# Patient Record
Sex: Male | Born: 1960 | Race: Black or African American | Hispanic: No | Marital: Married | Smoking: Former smoker
Health system: Southern US, Community
[De-identification: ages and names within clinical notes are randomized; demographics above are authoritative.]

## PROBLEM LIST (undated history)

## (undated) ENCOUNTER — Ambulatory Visit: Discharge status: Absent without leave | Payer: Medicare HMO

## (undated) DIAGNOSIS — M79673 Pain in unspecified foot: Secondary | ICD-10-CM

## (undated) DIAGNOSIS — H409 Unspecified glaucoma: Secondary | ICD-10-CM

## (undated) DIAGNOSIS — H31012 Macula scars of posterior pole (postinflammatory) (post-traumatic), left eye: Secondary | ICD-10-CM

## (undated) DIAGNOSIS — H4720 Unspecified optic atrophy: Secondary | ICD-10-CM

## (undated) DIAGNOSIS — M21969 Unspecified acquired deformity of unspecified lower leg: Secondary | ICD-10-CM

## (undated) DIAGNOSIS — M79672 Pain in left foot: Secondary | ICD-10-CM

## (undated) DIAGNOSIS — L84 Corns and callosities: Secondary | ICD-10-CM

## (undated) DIAGNOSIS — M549 Dorsalgia, unspecified: Secondary | ICD-10-CM

## (undated) DIAGNOSIS — H2510 Age-related nuclear cataract, unspecified eye: Secondary | ICD-10-CM

## (undated) DIAGNOSIS — Q828 Other specified congenital malformations of skin: Secondary | ICD-10-CM

## (undated) DIAGNOSIS — I10 Essential (primary) hypertension: Secondary | ICD-10-CM

## (undated) HISTORY — PX: COLONOSCOPY WITH ESOPHAGOGASTRODUODENOSCOPY (EGD): SHX5779

## (undated) HISTORY — PX: HERNIA REPAIR: SHX51

## (undated) HISTORY — PX: CHOLECYSTECTOMY: SHX55

## (undated) HISTORY — PX: FOOT SURGERY: SHX648

---

## 2009-11-02 ENCOUNTER — Ambulatory Visit: Payer: Self-pay | Admitting: Internal Medicine

## 2009-12-25 ENCOUNTER — Emergency Department: Payer: Self-pay | Admitting: Internal Medicine

## 2009-12-25 ENCOUNTER — Emergency Department: Payer: Self-pay | Admitting: Emergency Medicine

## 2010-03-20 ENCOUNTER — Ambulatory Visit: Payer: Self-pay | Admitting: Internal Medicine

## 2010-09-11 ENCOUNTER — Ambulatory Visit: Payer: Self-pay | Admitting: Internal Medicine

## 2012-09-13 ENCOUNTER — Ambulatory Visit: Payer: Self-pay | Admitting: Internal Medicine

## 2013-12-08 ENCOUNTER — Ambulatory Visit: Payer: Self-pay

## 2016-06-08 ENCOUNTER — Ambulatory Visit (INDEPENDENT_AMBULATORY_CARE_PROVIDER_SITE_OTHER): Payer: Medicare HMO

## 2016-06-08 ENCOUNTER — Ambulatory Visit
Admission: EM | Admit: 2016-06-08 | Discharge: 2016-06-08 | Disposition: A | Discharge status: Home / Self Care | Payer: Medicare HMO | Attending: Family Medicine | Admitting: Family Medicine

## 2016-06-08 DIAGNOSIS — K229 Disease of esophagus, unspecified: Secondary | ICD-10-CM

## 2016-06-08 HISTORY — DX: Pain in left foot: M79.672

## 2016-06-08 HISTORY — DX: Unspecified optic atrophy: H47.20

## 2016-06-08 HISTORY — DX: Dorsalgia, unspecified: M54.9

## 2016-06-08 HISTORY — DX: Corns and callosities: L84

## 2016-06-08 HISTORY — DX: Age-related nuclear cataract, unspecified eye: H25.10

## 2016-06-08 HISTORY — DX: Other specified congenital malformations of skin: Q82.8

## 2016-06-08 HISTORY — DX: Macula scars of posterior pole (postinflammatory) (post-traumatic), left eye: H31.012

## 2016-06-08 HISTORY — DX: Essential (primary) hypertension: I10

## 2016-06-08 HISTORY — DX: Unspecified glaucoma: H40.9

## 2016-06-08 HISTORY — DX: Pain in unspecified foot: M79.673

## 2016-06-08 HISTORY — DX: Unspecified acquired deformity of unspecified lower leg: M21.969

## 2016-06-08 MED ORDER — SUCRALFATE 1 G PO TABS: 2.0000 g | ORAL_TABLET | Freq: Two times a day (BID) | ORAL | 0 refills | Status: DC

## 2016-06-08 MED ORDER — GI COCKTAIL ~~LOC~~
30.0000 mL | Freq: Once | ORAL | Status: AC
  Administered 2016-06-08: 30 mL via ORAL

## 2016-06-08 NOTE — ED Provider Notes (Signed)
[facility]    CSN: [id number] Arrival date & time: 06/08/16  4098     History   Chief Complaint Chief Complaint  Patient presents with  . Aspiration    HPI Chris Cummings is a 56 y.o. male.   Patient is a 56 year old black male who comes in today with a three-day history of esophageal irritation spasm. States he knows fullness Thursday as far as difficulty swallowing food and having discomfort when he ate. States Thursday night and then last night got worse Friday night. He denies ever having trouble like this per se. He did have indigestion before he had his gallbladder out but that was removed about a year ago since then he's not had indigestion and this doesn't feel like indigestion he was having before. He states he only hurts only has discomfort when he started to swallow or eat food. Is much. He stop smoking as a teenager he's had multiple foot surgeries neuropathy. No known drug allergies. No significant family milk history relevant to today's visit. Past medical history does have history of hypertension as well.   The history is provided by the patient and the spouse. No language interpreter was used.  Swallowed Foreign Body  The current episode started more than 2 days ago. Pertinent negatives include no chest pain, no abdominal pain, no headaches and no shortness of breath. The symptoms are aggravated by drinking and swallowing. He has tried nothing for the symptoms.    Past Medical History:  Diagnosis Date  . Back pain   . Callus of foot   . Foot deformity   . Foot pain   . Foot pain, left   . Glaucoma   . Hypertension   . Keratoderma   . Macular scar of left eye   . Optic atrophy of left eye   . Senile nuclear sclerosis     There are no active problems to display for this patient.   Past Surgical History:  Procedure Laterality Date  . CHOLECYSTECTOMY    . COLONOSCOPY WITH ESOPHAGOGASTRODUODENOSCOPY (EGD)    . FOOT SURGERY    . HERNIA REPAIR          Home Medications    Prior to Admission medications   Medication Sig Start Date End Date Taking? Authorizing Provider  azelastine (OPTIVAR) 0.05 % ophthalmic solution 1 drop as needed.   Yes Historical Provider, MD  cetirizine (ZYRTEC) 10 MG tablet Take 10 mg by mouth daily.   Yes Historical Provider, MD  gabapentin (NEURONTIN) 600 MG tablet Take 600 mg by mouth 2 (two) times daily.   Yes Historical Provider, MD  ibuprofen (ADVIL,MOTRIN) 400 MG tablet Take 400 mg by mouth every 6 (six) hours as needed.   Yes Historical Provider, MD  losartan-hydrochlorothiazide (HYZAAR) 100-25 MG tablet Take 1 tablet by mouth daily.   Yes Historical Provider, MD  meloxicam (MOBIC) 15 MG tablet Take 15 mg by mouth daily.   Yes Historical Provider, MD  Multiple Vitamins-Minerals (MULTIVITAMIN WITH MINERALS) tablet Take 1 tablet by mouth daily.   Yes Historical Provider, MD  naproxen sodium (ANAPROX) 220 MG tablet Take 220 mg by mouth 2 (two) times daily with a meal.   Yes Historical Provider, MD  oxyCODONE (ROXICODONE) 15 MG immediate release tablet Take 15 mg by mouth every 4 (four) hours as needed for pain.   Yes Historical Provider, MD  sucralfate (CARAFATE) 1 g tablet Take 2 tablets (2 g total) by mouth 2 (two) times daily. An  hour before eating. 06/08/16   Hassan RowanEugene Niema Carrara, MD    Family History No family history on file.  Social History Social History  Substance Use Topics  . Smoking status: Former Games developermoker  . Smokeless tobacco: Former NeurosurgeonUser  . Alcohol use Yes     Allergies   Patient has no known allergies.   Review of Systems Review of Systems  Respiratory: Negative for shortness of breath.   Cardiovascular: Negative for chest pain.  Gastrointestinal: Negative for abdominal pain.       Difficulty swallowing  Neurological: Negative for headaches.     Physical Exam Triage Vital Signs ED Triage Vitals  Enc Vitals Group     BP      Pulse      Resp      Temp      Temp src      SpO2       Weight      Height      Head Circumference      Peak Flow      Pain Score      Pain Loc      Pain Edu?      Excl. in GC?    No data found.   Updated Vital Signs BP (!) 160/110 (BP Location: Left Arm)   Pulse 74   Temp 97.9 F (36.6 C) (Oral)   Resp 16   Ht 6' (1.829 m)   Wt 300 lb (136.1 kg)   SpO2 97%   BMI 40.69 kg/m   Visual Acuity Right Eye Distance:   Left Eye Distance:   Bilateral Distance:    Right Eye Near:   Left Eye Near:    Bilateral Near:     Physical Exam  Constitutional: He appears well-developed and well-nourished. He is active.  Non-toxic appearance. He does not have a sickly appearance. He does not appear ill. No distress.  HENT:  Head: Normocephalic and atraumatic.  Eyes: Pupils are equal, round, and reactive to light.  Neck: Normal range of motion. Neck supple. No thyromegaly present.  Cardiovascular: Normal rate.   Pulmonary/Chest: Effort normal and breath sounds normal.  Abdominal: Soft. Bowel sounds are normal. He exhibits no distension.  Musculoskeletal: Normal range of motion.  Neurological: He is alert.  Skin:  Patient is more scar tissue over his right forearm where they removed tendon graft for his leg  Psychiatric: He has a normal mood and affect.  Vitals reviewed.    UC Treatments / Results  Labs (all labs ordered are listed, but only abnormal results are displayed) Labs Reviewed - No data to display  EKG  EKG Interpretation None       Radiology Dg Chest 2 View  Result Date: 06/08/2016 CLINICAL DATA:  Dysphagia. EXAM: CHEST  2 VIEW COMPARISON:  None. FINDINGS: The heart size and mediastinal contours are within normal limits. Both lungs are clear. No pneumothorax or pleural effusion is noted. The visualized skeletal structures are unremarkable. IMPRESSION: No active cardiopulmonary disease. Electronically Signed   By: Lupita RaiderJames  Green Jr, M.D.   On: 06/08/2016 10:41    Procedures Procedures (including critical care  time)  Medications Ordered in UC Medications  gi cocktail (Maalox,Lidocaine,Donnatal) (30 mLs Oral Given 06/08/16 1019)     Initial Impression / Assessment and Plan / UC Course  I have reviewed the triage vital signs and the nursing notes.  Pertinent labs & imaging results that were available during my care of the patient were reviewed by me  and considered in my medical decision making (see chart for details).  Clinical Course as of Jun 09 1130  Sat Jun 08, 2016  1054 DG Chest 2 View [EW]    Clinical Course User Index [EW] Hassan Rowan, MD   Chest x-ray was negative because chest x-ray is negative and the only symptoms he has occurs after he swallows he was given upper GI which did seem to help his symptoms and problems. Going recommend follow-up with his PCP strongly suggest he get GI referral for upper endoscopy in the very near future. For now we'll place him on Carafate 2 tablets twice a day. Warned this could be coming from some type of esophageal stricture or esophageal reflux or irritation but going to really find out as positive with upper endoscopy. Also blood pressure was elevated so his blood pressures rechecked while he was here and still found to be elevated even though the esophageal discomfort was better. Turns out that because the difficulty has swallowing he's not taken his blood pressure medicine for the last 2 days. This point no further attempts to measure his blood pressure will be done since his stroke on probably be elevated. Strongly recommend he takes blood pressure medicine when gets home and have this followed up with his PCP as well.  Final Clinical Impressions(s) / UC Diagnoses   Final diagnoses:  Esophageal problem    New Prescriptions Discharge Medication List as of 06/08/2016 11:07 AM      Note: This dictation was prepared with Dragon dictation along with smaller phrase technology. Any transcriptional errors that result from this process are  unintentional.   Hassan Rowan, MD 06/08/16 1134

## 2016-06-08 NOTE — ED Triage Notes (Signed)
As per patient feels pain while swallowing foods onset yesterday it feels like heart burn but it's not heart burn as per patient and had a gall bladder surgery in past.

## 2016-06-11 ENCOUNTER — Telehealth: Payer: Self-pay

## 2016-06-11 NOTE — Telephone Encounter (Signed)
Courtesy call back completed today after patient's visit at [facility]. Patient improved and will call back with any questions or concerns.  

## 2016-12-28 ENCOUNTER — Encounter: Payer: Self-pay | Admitting: Emergency Medicine

## 2016-12-28 ENCOUNTER — Ambulatory Visit
Admission: EM | Admit: 2016-12-28 | Discharge: 2016-12-28 | Disposition: A | Discharge status: Hospice | Payer: Medicare HMO | Attending: Emergency Medicine | Admitting: Emergency Medicine

## 2016-12-28 DIAGNOSIS — Z87891 Personal history of nicotine dependence: Secondary | ICD-10-CM | POA: Diagnosis not present

## 2016-12-28 DIAGNOSIS — H2510 Age-related nuclear cataract, unspecified eye: Secondary | ICD-10-CM | POA: Insufficient documentation

## 2016-12-28 DIAGNOSIS — I160 Hypertensive urgency: Secondary | ICD-10-CM | POA: Diagnosis present

## 2016-12-28 DIAGNOSIS — R51 Headache: Secondary | ICD-10-CM

## 2016-12-28 DIAGNOSIS — H409 Unspecified glaucoma: Secondary | ICD-10-CM | POA: Insufficient documentation

## 2016-12-28 DIAGNOSIS — I10 Essential (primary) hypertension: Secondary | ICD-10-CM | POA: Diagnosis not present

## 2016-12-28 NOTE — ED Provider Notes (Signed)
[facility]    CSN: [id number] Arrival date & time: 12/28/16  1210     History   Chief Complaint Chief Complaint  Patient presents with  . Hypertension    HPI Chris Cummings is a 56 y.o. male.   Pt reports waking up ~ 0800 today and "I knew my Blood pressure was up, took meds(Losartan HCTZ), still has headache, now c/o pain radiating from back of head down left side of neck to right hand, pt states he normally has neck pain in back with HA when pressure is up but left arm pain is normal. Pt denies Cp, palpitations, SOB, or DOE.   BP is 167/119 Right arm, left arm is 171/109   The history is provided by the patient and a relative.  Hypertension  This is a new problem. The current episode started 3 to 5 hours ago. The problem occurs constantly. The problem has not changed since onset.Associated symptoms include headaches. Pertinent negatives include no chest pain, no abdominal pain and no shortness of breath. Nothing aggravates the symptoms. Nothing relieves the symptoms. Treatments tried: BP med. The treatment provided no relief.    Past Medical History:  Diagnosis Date  . Back pain   . Callus of foot   . Foot deformity   . Foot pain   . Foot pain, left   . Glaucoma   . Hypertension   . Keratoderma   . Macular scar of left eye   . Optic atrophy of left eye   . Senile nuclear sclerosis     Patient Active Problem List   Diagnosis Date Noted  . Hypertensive urgency 12/28/2016    Past Surgical History:  Procedure Laterality Date  . CHOLECYSTECTOMY    . COLONOSCOPY WITH ESOPHAGOGASTRODUODENOSCOPY (EGD)    . FOOT SURGERY    . HERNIA REPAIR         Home Medications    Prior to Admission medications   Medication Sig Start Date End Date Taking? Authorizing Provider  cetirizine (ZYRTEC) 10 MG tablet Take 10 mg by mouth daily.   Yes [provider]  gabapentin (NEURONTIN) 600 MG tablet Take 600 mg by mouth 2 (two) times daily.   Yes [provider]  losartan-hydrochlorothiazide (HYZAAR) 100-25 MG tablet Take 1 tablet by mouth daily.   Yes [provider]  azelastine (OPTIVAR) 0.05 % ophthalmic solution 1 drop as needed.    [provider]  ibuprofen (ADVIL,MOTRIN) 400 MG tablet Take 400 mg by mouth every 6 (six) hours as needed.    [provider]  meloxicam (MOBIC) 15 MG tablet Take 15 mg by mouth daily.    [provider]  Multiple Vitamins-Minerals (MULTIVITAMIN WITH MINERALS) tablet Take 1 tablet by mouth daily.    [provider]  naproxen sodium (ANAPROX) 220 MG tablet Take 220 mg by mouth 2 (two) times daily with a meal.    [provider]  oxyCODONE (ROXICODONE) 15 MG immediate release tablet Take 15 mg by mouth every 4 (four) hours as needed for pain.    [provider]  sucralfate (CARAFATE) 1 g tablet Take 2 tablets (2 g total) by mouth 2 (two) times daily. An hour before eating. 06/08/16   Hassan Rowan, MD    Family History No family history on file.  Social History Social History  Substance Use Topics  . Smoking status: Former Games developer  . Smokeless tobacco: Former Neurosurgeon  . Alcohol use Yes  Allergies   Patient has no known allergies.   Review of Systems Review of Systems  Respiratory: Negative for cough and shortness of breath.   Cardiovascular: Negative for chest pain and palpitations.  Gastrointestinal: Negative for abdominal pain.  Neurological: Positive for headaches.  All other systems reviewed and are negative.    Physical Exam Triage Vital Signs ED Triage Vitals  Enc Vitals Group     BP 12/28/16 1253 (!) 160/104     Pulse Rate 12/28/16 1253 66     Resp 12/28/16 1253 18     Temp 12/28/16 1253 98.2 F (36.8 C)     Temp Source 12/28/16 1253 Oral     SpO2 12/28/16 1253 94 %     Weight 12/28/16 1254 (!) 310 lb (140.6 kg)     Height 12/28/16 1254  (1.803 m)     Head Circumference --      Peak Flow --      Pain Score  12/28/16 1254 5     Pain Loc --      Pain Edu? --      Excl. in GC? --    No data found.   Updated Vital Signs BP (!) 171/109 (BP Location: Left Arm) Comment: left arm  Pulse 66   Temp 98.2 F (36.8 C) (Oral)   Resp 18   Ht  (1.803 m)   Wt (!) 310 lb (140.6 kg)   SpO2 94%   BMI 43.24 kg/m   Visual Acuity  Physical Exam  Constitutional: He is oriented to person, place, and time. He appears well-developed and well-nourished. He is active and cooperative. He appears distressed.  Pt is hypertensive  HENT:  Head: Normocephalic.  Mouth/Throat: Uvula is midline, oropharynx is clear and moist and mucous membranes are normal.  Eyes: Pupils are equal, round, and reactive to light.  Neck: Normal range of motion.  Cardiovascular: Normal rate, regular rhythm, normal heart sounds and intact distal pulses.   Pulmonary/Chest: Effort normal and breath sounds normal.  Abdominal:  obese  Musculoskeletal: Normal range of motion.  Neurological: He is alert and oriented to person, place, and time. He has normal strength. No cranial nerve deficit or sensory deficit. Gait normal.  Skin: Skin is dry.  Psychiatric: He has a normal mood and affect. His speech is normal.  Nursing note and vitals reviewed.    UC Treatments / Results  Labs (all labs ordered are listed, but only abnormal results are displayed) Labs Reviewed - No data to display  EKG  EKG Interpretation None       Radiology No results found.  Procedures Procedures (including critical care time)  Medications Ordered in UC Medications - No data to display   Initial Impression / Assessment and Plan / UC Course  I have reviewed the triage vital signs and the nursing notes.  Pertinent labs & imaging results that were available during my care of the patient were reviewed by me and considered in my medical decision making (see chart for details).   EKG obtained d/t age, BP CC: shows NSR rate 62, no ectopy, normal  axis, no STEMI.   Discussed EKG and plan of care with Dr. Chaney Malling, per MD,  advised to send pt to Er for further evaluation most like IV Labetalol additional testing at ER(we do not have CT or IV meds; recommend EMS transport, wife is present and both pt and wife decline EMS transport, want to drive to ER, state they will  go to [facility] ER now. Will call report. Pt denies Cp,SOB, palpitations or additional s/s at d/c. Pt verbalized understanding to this provider  Final Clinical Impressions(s) / UC Diagnoses   Final diagnoses:  Hypertensive urgency    New Prescriptions Discharge Medication List as of 12/28/2016  1:30 PM          Janiyah Beery, Para March, NP 12/28/16 1730

## 2016-12-28 NOTE — Discharge Instructions (Signed)
Do not eat or drink anything. Go straight to Er for further evaluation. Pt and wife verbalize understanding to this provider.

## 2016-12-28 NOTE — ED Triage Notes (Signed)
Elevated blood pressure, headache, pain radiates from neck down left arm started this morning

## 2017-07-04 IMAGING — CR DG CHEST 2V
3 series · 3 of 3 positions shown · non-contrast
Comparison: None.

CLINICAL DATA: Dysphagia.

EXAM:
CHEST  2 VIEW

[chest pa]
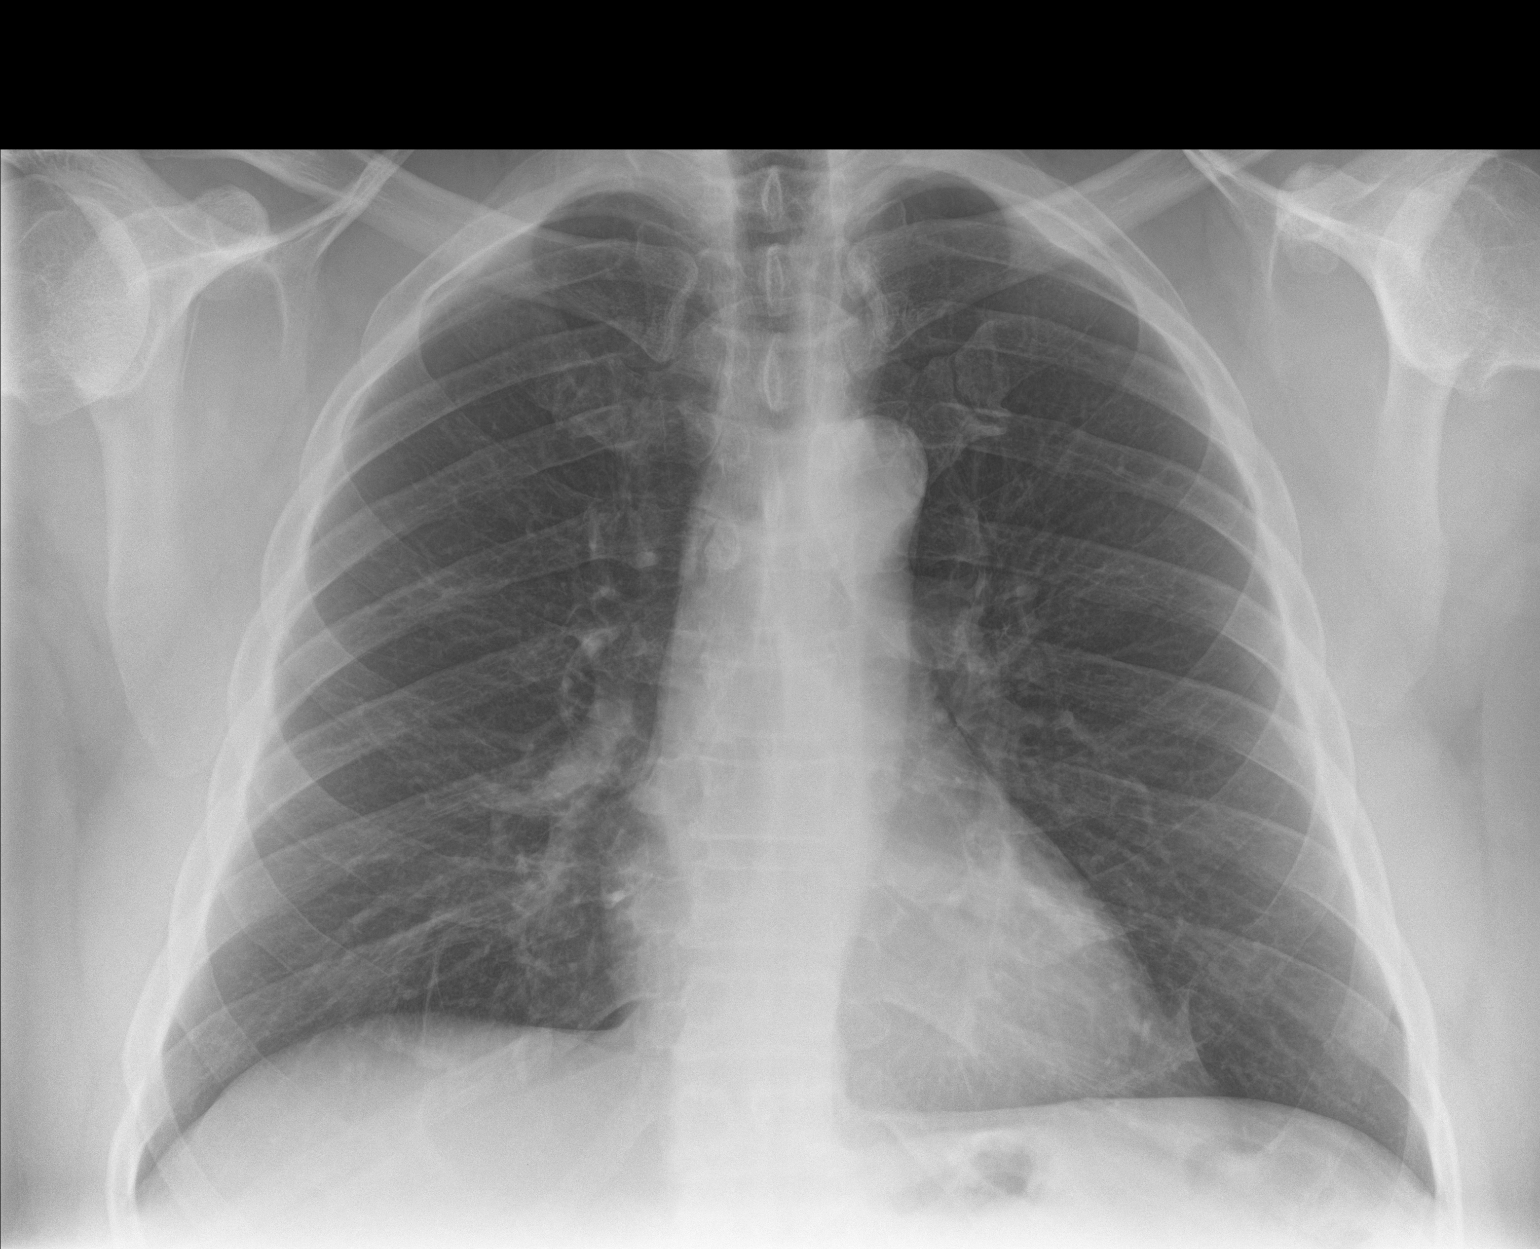

[chest lat (1 of 2)]
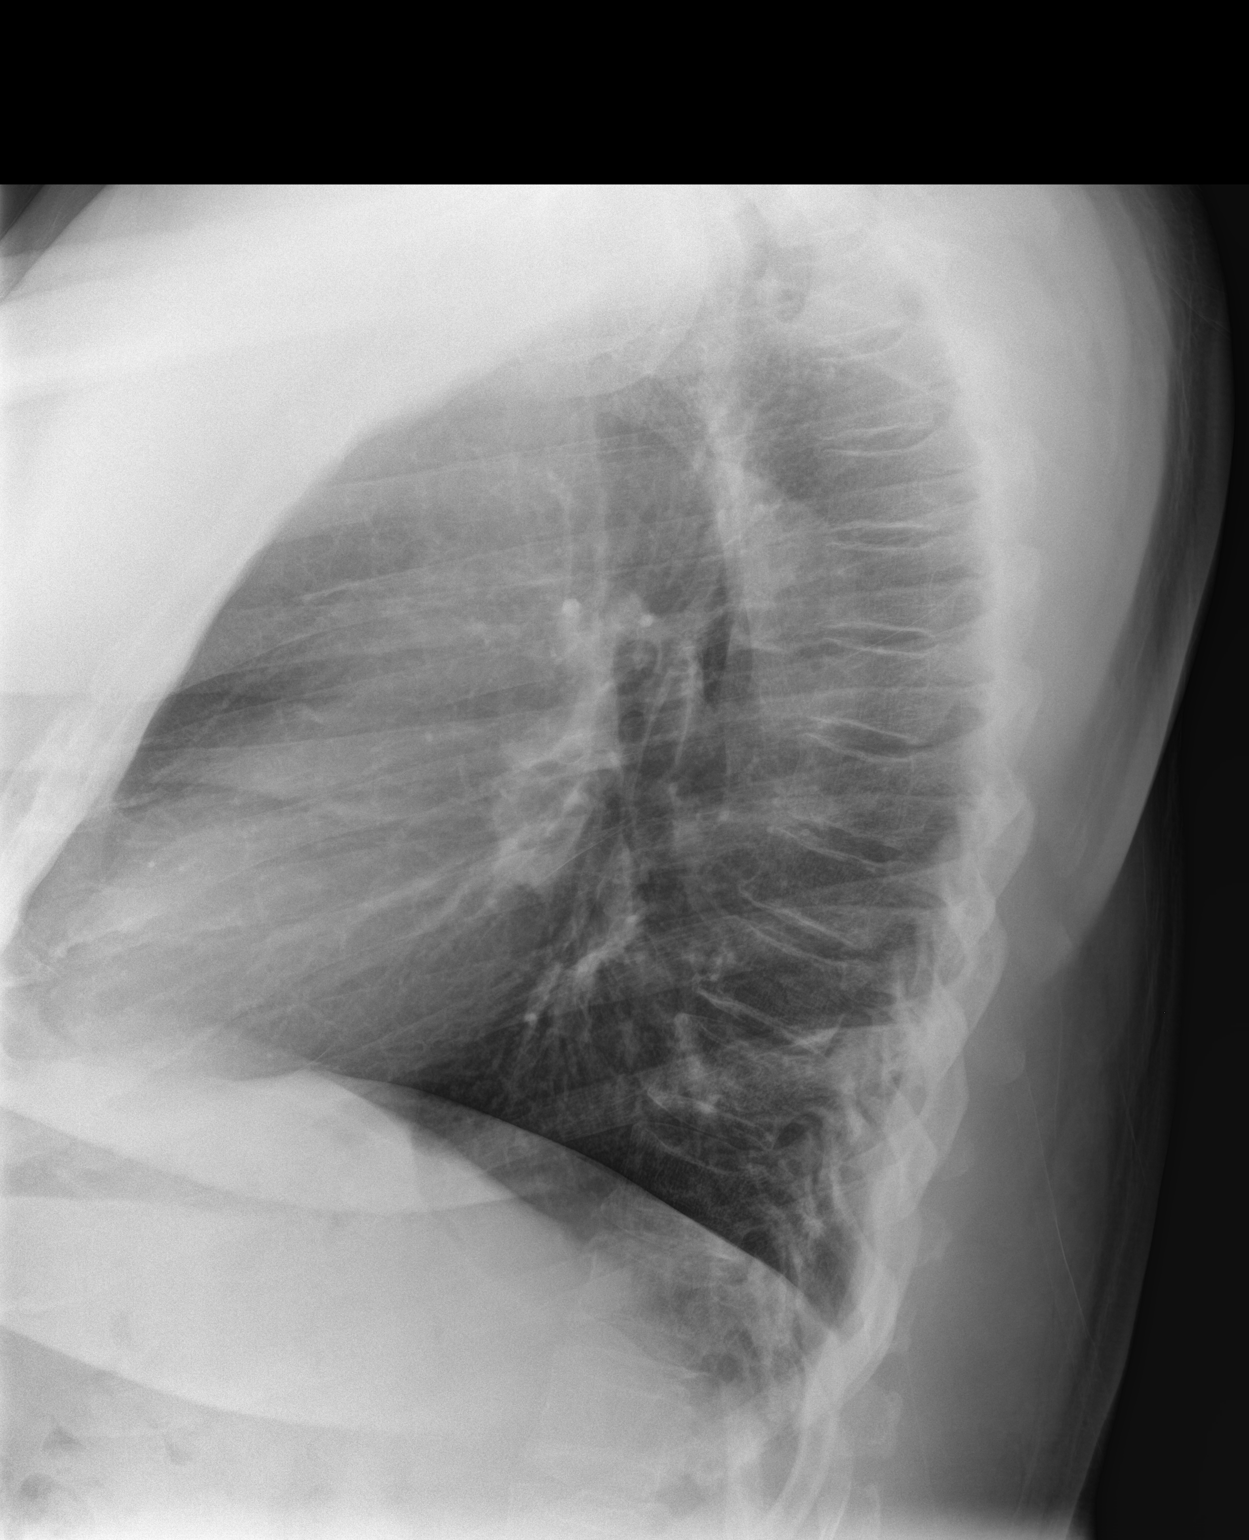

[chest lat (2 of 2)]
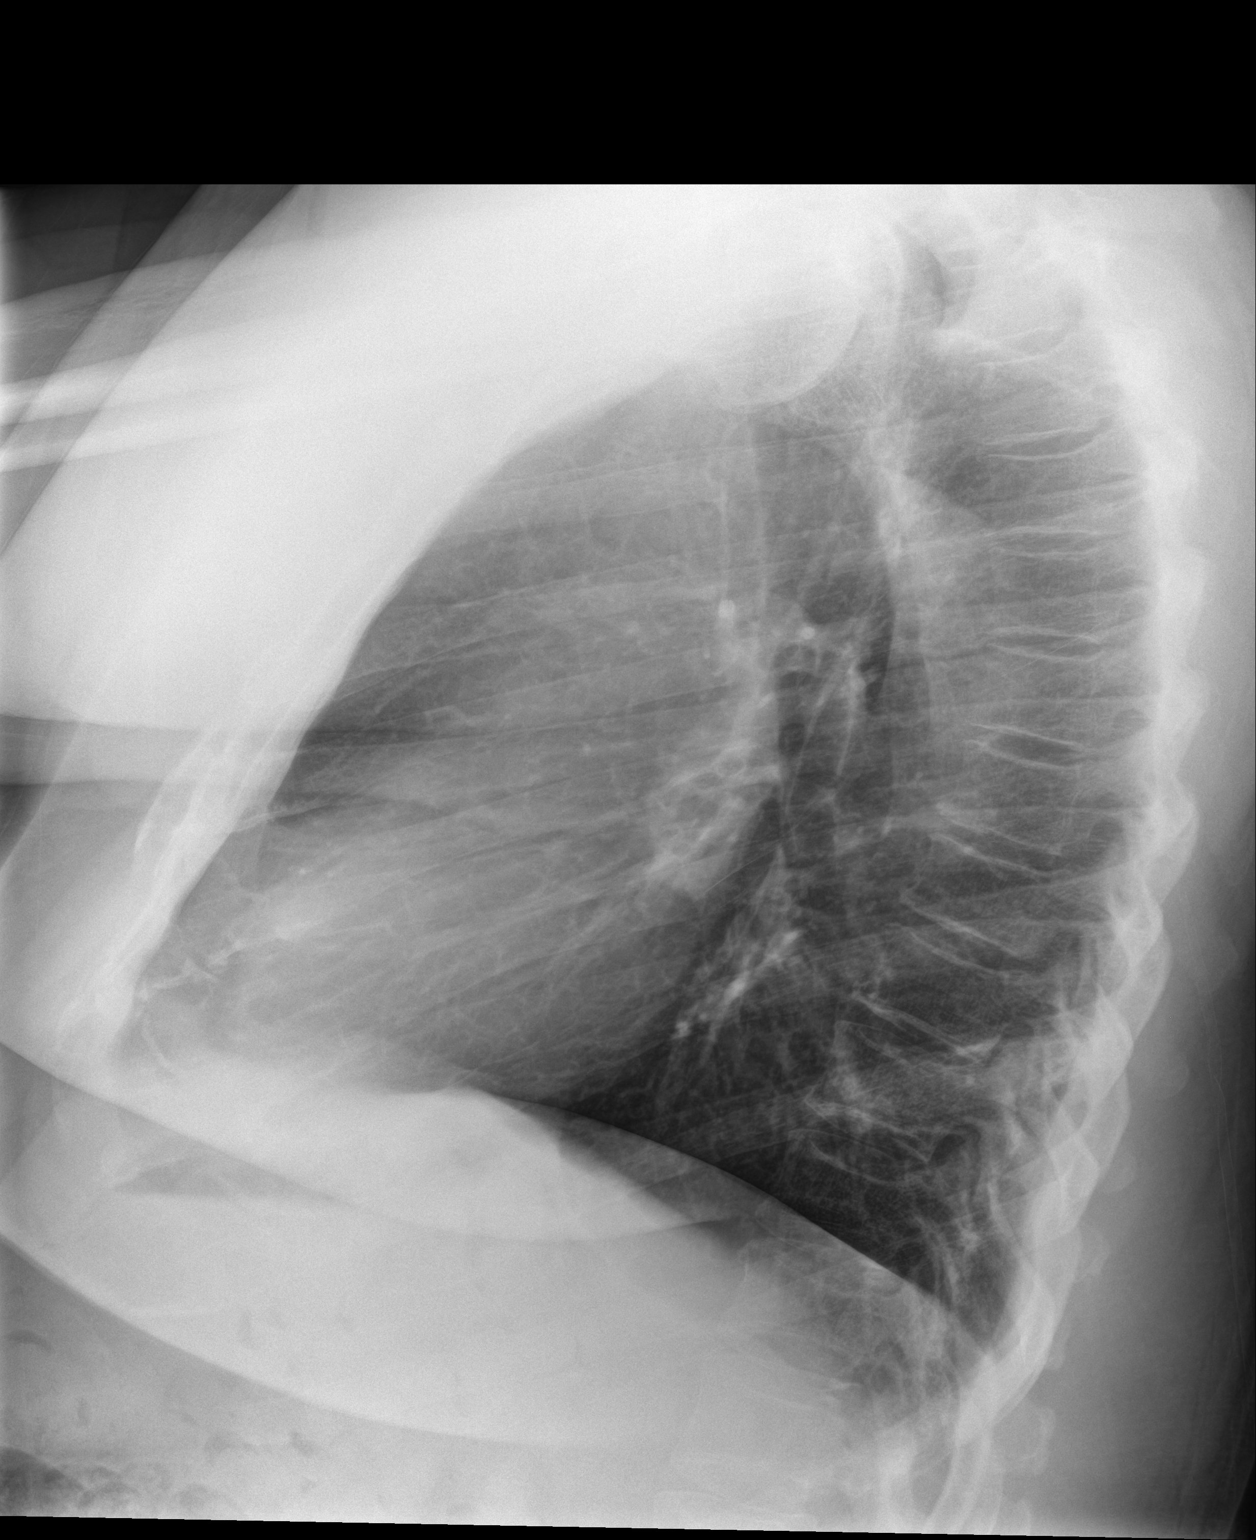

[3 of 3 positions shown; findings below may reference images not displayed]

FINDINGS: The heart size and mediastinal contours are within normal limits.
Both lungs are clear. No pneumothorax or pleural effusion is noted.
The visualized skeletal structures are unremarkable.
IMPRESSION: No active cardiopulmonary disease.

## 2017-08-14 ENCOUNTER — Other Ambulatory Visit: Payer: Self-pay

## 2017-08-14 ENCOUNTER — Ambulatory Visit
Admission: EM | Admit: 2017-08-14 | Discharge: 2017-08-14 | Disposition: A | Discharge status: Home / Self Care | Payer: Medicare HMO | Attending: Family Medicine | Admitting: Family Medicine

## 2017-08-14 ENCOUNTER — Encounter: Payer: Self-pay | Admitting: Gynecology

## 2017-08-14 DIAGNOSIS — R51 Headache: Secondary | ICD-10-CM | POA: Diagnosis not present

## 2017-08-14 DIAGNOSIS — M542 Cervicalgia: Secondary | ICD-10-CM

## 2017-08-14 MED ORDER — CYCLOBENZAPRINE HCL 10 MG PO TABS: 10.0000 mg | ORAL_TABLET | Freq: Three times a day (TID) | ORAL | 0 refills | Status: DC | PRN
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
## 2017-08-14 NOTE — Discharge Instructions (Signed)
Rest.  Heat as needed.  Use the muscle relaxer to help.  Tylenol if needed in addition to your normal pain medication.  Take care  Dr. Adriana Simas

## 2018-02-16 ENCOUNTER — Ambulatory Visit
Admission: EM | Admit: 2018-02-16 | Discharge: 2018-02-16 | Disposition: A | Discharge status: Home / Self Care | Payer: Medicare HMO | Attending: Family Medicine | Admitting: Family Medicine

## 2018-02-16 ENCOUNTER — Encounter: Payer: Self-pay | Admitting: Emergency Medicine

## 2018-02-16 ENCOUNTER — Other Ambulatory Visit: Payer: Self-pay

## 2018-02-16 DIAGNOSIS — J029 Acute pharyngitis, unspecified: Secondary | ICD-10-CM

## 2018-02-16 LAB — RAPID STREP SCREEN (MED CTR MEBANE ONLY): Streptococcus, Group A Screen (Direct): NEGATIVE

## 2018-02-16 MED ORDER — LIDOCAINE VISCOUS HCL 2 % MT SOLN: OROMUCOSAL | 0 refills | Status: DC

## 2018-02-16 NOTE — ED Provider Notes (Signed)
[facility]    CSN: [id number] Arrival date & time: 02/16/18  4098     History   Chief Complaint Chief Complaint  Patient presents with  . Sore Throat    HPI Chris Cummings is a 57 y.o. male.   The history is provided by the patient.  Sore Throat  This is a new problem. The current episode started more than 2 days ago. The problem occurs constantly. The problem has not changed since onset.Pertinent negatives include no chest pain, no abdominal pain, no headaches and no shortness of breath. He has tried acetaminophen for the symptoms.    Past Medical History:  Diagnosis Date  . Back pain   . Callus of foot   . Foot deformity   . Foot pain   . Foot pain, left   . Glaucoma   . Hypertension   . Keratoderma   . Macular scar of left eye   . Optic atrophy of left eye   . Senile nuclear sclerosis     Patient Active Problem List   Diagnosis Date Noted  . Hypertensive urgency 12/28/2016    Past Surgical History:  Procedure Laterality Date  . CHOLECYSTECTOMY    . COLONOSCOPY WITH ESOPHAGOGASTRODUODENOSCOPY (EGD)    . FOOT SURGERY    . HERNIA REPAIR         Home Medications    Prior to Admission medications   Medication Sig Start Date End Date Taking? Authorizing Provider  cetirizine (ZYRTEC) 10 MG tablet Take 10 mg by mouth daily.   Yes [provider]  cyclobenzaprine (FLEXERIL) 10 MG tablet Take 1 tablet (10 mg total) by mouth 3 (three) times daily as needed. 08/14/17  Yes Cook, Jayce G, DO  losartan-hydrochlorothiazide (HYZAAR) 100-25 MG tablet Take 1 tablet by mouth daily.   Yes [provider]  oxyCODONE (ROXICODONE) 15 MG immediate release tablet Take 15 mg by mouth every 4 (four) hours as needed for pain.   Yes [provider]  azelastine (OPTIVAR) 0.05 % ophthalmic solution 1 drop as needed.    [provider]  cholecalciferol (VITAMIN D) 1000 units tablet Take by mouth daily.    [provider]    lidocaine (XYLOCAINE) 2 % solution 20 ml gargle and spit q 6 hours prn sore throat 02/16/18   [name], MD    Family History History reviewed. No pertinent family history.  Social History Social History   Tobacco Use  . Smoking status: Former Games developer  . Smokeless tobacco: Former Engineer, water Use Topics  . Alcohol use: Yes  . Drug use: No     Allergies   Patient has no known allergies.   Review of Systems Review of Systems  Respiratory: Negative for shortness of breath.   Cardiovascular: Negative for chest pain.  Gastrointestinal: Negative for abdominal pain.  Neurological: Negative for headaches.     Physical Exam Triage Vital Signs ED Triage Vitals  Enc Vitals Group     BP 02/16/18 0844 (!) 158/106     Pulse Rate 02/16/18 0844 75     Resp 02/16/18 0844 18     Temp 02/16/18 0844 97.9 F (36.6 C)     Temp Source 02/16/18 0844 Oral     SpO2 02/16/18 0844 97 %     Weight 02/16/18 0842 (!) 305 lb (138.3 kg)     Height 02/16/18 0842 6' (1.829 m)     Head Circumference --      Peak  Flow --      Pain Score 02/16/18 0842 4     Pain Loc --      Pain Edu? --      Excl. in GC? --    No data found.  Updated Vital Signs BP (!) 158/106 (BP Location: Left Arm)   Pulse 75   Temp 97.9 F (36.6 C) (Oral)   Resp 18   Ht 6' (1.829 m)   Wt (!) 138.3 kg   SpO2 97%   BMI 41.37 kg/m   Visual Acuity Right Eye Distance:   Left Eye Distance:   Bilateral Distance:    Right Eye Near:   Left Eye Near:    Bilateral Near:     Physical Exam  Constitutional: He appears well-developed and well-nourished.  Non-toxic appearance. He does not appear ill. No distress.  HENT:  Head: Normocephalic and atraumatic.  Right Ear: Tympanic membrane and ear canal normal.  Left Ear: Tympanic membrane and ear canal normal.  Nose: Nose normal.  Mouth/Throat: Uvula is midline and mucous membranes are normal. Oral lesions (2 superficial ulcers (2mm each) on posterior pharynx )  present. Posterior oropharyngeal erythema present. No tonsillar exudate.  Nursing note and vitals reviewed.    UC Treatments / Results  Labs (all labs ordered are listed, but only abnormal results are displayed) Labs Reviewed  RAPID STREP SCREEN ([facility] ONLY)  CULTURE, GROUP A STREP [facility])    EKG None  Radiology No results found.  Procedures Procedures (including critical care time)  Medications Ordered in UC Medications - No data to display  Initial Impression / Assessment and Plan / UC Course  I have reviewed the triage vital signs and the nursing notes.  Pertinent labs & imaging results that were available during my care of the patient were reviewed by me and considered in my medical decision making (see chart for details).      Final Clinical Impressions(s) / UC Diagnoses   Final diagnoses:  Viral pharyngitis   Discharge Instructions   None    ED Prescriptions    Medication Sig Dispense Auth. Provider   lidocaine (XYLOCAINE) 2 % solution 20 ml gargle and spit q 6 hours prn sore throat 100 mL [name], MD     1. Lab results and diagnosis reviewed with patient 2. rx as per orders above; reviewed possible side effects, interactions, risks and benefits  3. Recommend supportive treatment otc analgesics prn  4. Follow-up prn if symptoms worsen or don't improve   Controlled Substance Prescriptions [place] Controlled Substance Registry consulted? Not Applicable   [name], MD 02/16/18 1004

## 2018-02-16 NOTE — ED Triage Notes (Signed)
Patient c/o sore throat that started 1 week ago. Patient has been taking OTC Sucrets with no relief. Patient denies fever.

## 2018-02-18 LAB — CULTURE, GROUP A STREP (THRC)

## 2019-03-23 ENCOUNTER — Other Ambulatory Visit: Payer: Self-pay

## 2019-03-23 ENCOUNTER — Ambulatory Visit
Admission: EM | Admit: 2019-03-23 | Discharge: 2019-03-23 | Disposition: A | Discharge status: Home / Self Care | Payer: Medicare HMO | Attending: Family Medicine | Admitting: Family Medicine

## 2019-03-23 DIAGNOSIS — Z7189 Other specified counseling: Secondary | ICD-10-CM

## 2019-03-23 DIAGNOSIS — Z20828 Contact with and (suspected) exposure to other viral communicable diseases: Secondary | ICD-10-CM | POA: Diagnosis not present

## 2019-03-23 DIAGNOSIS — Z20822 Contact with and (suspected) exposure to covid-19: Secondary | ICD-10-CM

## 2019-03-23 NOTE — ED Provider Notes (Addendum)
[place], [place]   Name: Chris Cummings DOB: 05-18-1960 MRN: [id number] CSN: [id number] PCP: Dortha Kern, MD  Arrival date and time:  03/23/19 1516  Chief Complaint:  covid testing   NOTE: Prior to seeing the patient today, I have reviewed the triage nursing documentation and vital signs. Clinical staff has updated patient's PMH/PSHx, current medication list, and drug allergies/intolerances to ensure comprehensive history available to assist in medical decision making.   History:   HPI: Chris Cummings is a 58 y.o. male who presents today with request for SARS-CoV-2 (novel coronavirus) testing. Patient denies recent direct exposure to the virus. Patient presents today with no symptoms; no cough, fevers, or other symptoms commonly associated with SARS-CoV-2. He advises that he feels generally well. Patient presents for testing today in preparation for his daughter being induced at [facility] on Friday of this week. He has spoken with staff at [facility] and they are going to allow he and his wife to be present for the birth of their grandchild if they are able to provide documentation of negative SARS-CoV-2 within 48-72 hours of their daughter entering the hospital.   Past Medical History:  Diagnosis Date  . Back pain   . Callus of foot   . Foot deformity   . Foot pain   . Foot pain, left   . Glaucoma   . Hypertension   . Keratoderma   . Macular scar of left eye   . Optic atrophy of left eye   . Senile nuclear sclerosis     Past Surgical History:  Procedure Laterality Date  . CHOLECYSTECTOMY    . COLONOSCOPY WITH ESOPHAGOGASTRODUODENOSCOPY (EGD)    . FOOT SURGERY    . HERNIA REPAIR      History reviewed. No pertinent family history.  Social History   Tobacco Use  . Smoking status: Former Games developer  . Smokeless tobacco: Former Engineer, water Use Topics  . Alcohol use: Yes  . Drug use: No    Patient Active Problem List   Diagnosis Date Noted  . Hypertensive urgency 12/28/2016      Home Medications:    Current Meds  Medication Sig  . amLODipine (NORVASC) 10 MG tablet Take by mouth.  . cholecalciferol (VITAMIN D) 1000 units tablet Take by mouth daily.  Marland Kitchen oxyCODONE (ROXICODONE) 15 MG immediate release tablet Take 15 mg by mouth every 4 (four) hours as needed for pain.    Allergies:   Patient has no known allergies.  Review of Systems (ROS): Review of Systems  Constitutional: Negative for fatigue and fever.  HENT: Negative for congestion, ear pain, postnasal drip, rhinorrhea, sinus pressure, sinus pain, sneezing and sore throat.   Eyes: Negative for pain, discharge and redness.  Respiratory: Negative for cough, chest tightness and shortness of breath.   Cardiovascular: Negative for chest pain and palpitations.  Gastrointestinal: Negative for abdominal pain, diarrhea, nausea and vomiting.  Musculoskeletal: Negative for arthralgias, back pain, myalgias and neck pain.  Skin: Negative for color change, pallor and rash.  Neurological: Negative for dizziness, syncope, weakness and headaches.  Hematological: Negative for adenopathy.   Vital Signs: Today's Vitals   03/23/19 1539 03/23/19 1542 03/23/19 1559  BP:  (!) 143/98   Pulse:  93   Resp:  18   Temp:  98 F (36.7 C)   TempSrc:  Oral   SpO2:  97%   PainSc: 0-No pain  0-No pain    Physical Exam: Physical Exam  Constitutional: She is  oriented to person, place, and time and well-developed, well-nourished, and in no distress.  HENT:  Head: Normocephalic and atraumatic.  Eyes: Pupils are equal, round, and reactive to light.  Cardiovascular: Normal rate, regular rhythm, normal heart sounds and intact distal pulses.  Pulmonary/Chest: Effort normal and breath sounds normal.  Neurological: She is alert and oriented to person, place, and time. Gait normal.  Skin: Skin is warm and dry. No rash noted. She is not diaphoretic.  Psychiatric: Mood, memory, affect and judgment normal.  Nursing note and vitals  reviewed.  Urgent Care Treatments / Results:  LABS: PLEASE NOTE: all labs that were ordered this encounter are listed, however only abnormal results are displayed. Labs Reviewed  NOVEL CORONAVIRUS, NAA (HOSP ORDER, SEND-OUT TO REF LAB; TAT 18-24 HRS)    EKG: -None  RADIOLOGY: No results found.  PROCEDURES: Procedures  MEDICATIONS RECEIVED THIS VISIT: Medications - No data to display  PERTINENT CLINICAL COURSE NOTES/UPDATES:   Initial Impression / Assessment and Plan / Urgent Care Course:  Pertinent labs & imaging results that were available during my care of the patient were personally reviewed by me and considered in my medical decision making (see lab/imaging section of note for values and interpretations).  Chris Cummings is a 58 y.o. male who presents to [facility] today with complaints of covid testing   Patient overall well appearing and in no acute distress today in clinic. Presenting symptoms (see HPI) and exam as documented above. He presents today requesting to be tested  for SARS-CoV-2 (novel coronavirus) so that he can be present for the birth of his grandchild later this week. Discussed typical symptom constellation, and patient denies any of the commonly associated symptoms. Given circumstances, testing is reasonable. SARS-CoV-2 swab collected by certified clinical staff. Discussed variable turn around times associated with testing, as swabs are being processed at [facility], and have been between 2-5 days to come back. He was advised to self quarantine, per [facility] DHHS guidelines, until negative results received.   Discussed follow up with primary care physician should he develop any concerning symptoms. I have reviewed the follow up and strict return precautions for any new or worsening symptoms. Patient is aware of symptoms that would be deemed urgent/emergent, and would thus require further evaluation either here or in the emergency department. At the time of  discharge, he verbalized understanding and consent with the discharge plan as it was reviewed with him. All questions were fielded by provider and/or clinic staff prior to patient discharge.     Final Clinical Impressions / Urgent Care Diagnoses:   Final diagnoses:  Encounter for laboratory testing for COVID-19 virus  Advice given about COVID-19 virus infection    New Prescriptions:  [place] Controlled Substance Registry consulted? Not Applicable  No orders of the defined types were placed in this encounter.   Recommended Follow up Care:  Patient encouraged to follow up with the following provider within the specified time frame, or sooner as dictated by the severity of his symptoms. As always, he was instructed that for any urgent/emergent care needs, he should seek care either here or in the emergency department for more immediate evaluation.  Follow-up Information    Clemmie Krill Lynnell Jude, MD.   Specialty: Family Medicine Why: As needed Contact information: [address] [place] [address] [phone]         NOTE: This note was prepared using Dragon dictation software along with smaller phrase technology. Despite my best ability to proofread,  there is the potential that transcriptional errors may still occur from this process, and are completely unintentional.     Verlee MonteGray, Analy Bassford E, NP 03/23/19 1623

## 2019-03-23 NOTE — Discharge Instructions (Addendum)
It was very nice seeing you today in clinic. Thank you for entrusting me with your care.   You were tested for SARS-CoV-2 (novel coronavirus) today. Your rapid testing was negative. You were tested for SARS-CoV-2 (novel coronavirus) today. Testing is performed by an outside lab ([facility]) and has variable turn around times ranging between 2-5 days. Current recommendations from the the CDC and [place] DHHS require that you remain at home until negative test results are have been received. In the event that your test results are positive, you will be contacted with further directives. These measures are being implemented out of an abundance of caution to prevent transmission and spread during the current SARS-CoV-2 pandemic.  If you develop any worsening symptoms or concerns, make arrangements to follow up with your regular doctor. If your symptoms are severe, please seek follow up care in the ER. Please remember, our [facility] providers are "right here with you" when you need us.   Again, it was my pleasure to take care of you today. Thank you for choosing our clinic. I hope that you start to feel better quickly.   Jermall Isaacson, MSN, APRN, FNP-C, CEN Advanced Practice Provider [facility] 

## 2019-03-23 NOTE — ED Triage Notes (Signed)
Patient states that he is here for Covid Testing. States that his daughter is being induced on Friday. Patient states that he has no symptoms.

## 2019-03-24 LAB — NOVEL CORONAVIRUS, NAA (HOSP ORDER, SEND-OUT TO REF LAB; TAT 18-24 HRS): SARS-CoV-2, NAA: NOT DETECTED

## 2020-04-09 ENCOUNTER — Other Ambulatory Visit: Payer: Self-pay

## 2020-04-09 ENCOUNTER — Ambulatory Visit
Admission: EM | Admit: 2020-04-09 | Discharge: 2020-04-09 | Disposition: A | Discharge status: Home / Self Care | Payer: Medicare HMO | Attending: Family Medicine | Admitting: Family Medicine

## 2020-04-09 DIAGNOSIS — J988 Other specified respiratory disorders: Secondary | ICD-10-CM | POA: Diagnosis not present

## 2020-04-09 DIAGNOSIS — U071 COVID-19: Secondary | ICD-10-CM | POA: Diagnosis not present

## 2020-04-09 DIAGNOSIS — B9789 Other viral agents as the cause of diseases classified elsewhere: Secondary | ICD-10-CM | POA: Diagnosis present

## 2020-04-09 DIAGNOSIS — Z20822 Contact with and (suspected) exposure to covid-19: Secondary | ICD-10-CM

## 2020-04-09 LAB — GROUP A STREP BY PCR: Group A Strep by PCR: NOT DETECTED

## 2020-04-09 MED ORDER — NAPROXEN 500 MG PO TABS: 500.0000 mg | ORAL_TABLET | Freq: Two times a day (BID) | ORAL | 0 refills | Status: DC | PRN

## 2020-04-09 NOTE — ED Triage Notes (Signed)
Patient complains of sore throat, headache and cough with fever x yesterday.

## 2020-04-09 NOTE — Discharge Instructions (Signed)
Medication as prescribed.  I am concerned that you have COVID-19. Test results should be available in the next 24 hours. Please check your MyChart account.  Stay home.  Take care  Dr. Adriana Simas

## 2020-04-09 NOTE — ED Provider Notes (Signed)
[facility]    CSN: [id number] Arrival date & time: 04/09/20  0957      History   Chief Complaint Chief Complaint  Patient presents with  . Sore Throat   HPI   60 year old male presents with respiratory symptoms.  Symptoms started on Friday.  Patient reports sore throat.  He also reports cough and headache.  Temperature has been as high as 99.  No documented true fever.  He has had a recent contact with several family members but no known Covid exposures.  No relieving factors.  No other reported symptoms.  No other complaints.  Past Medical History:  Diagnosis Date  . Back pain   . Callus of foot   . Foot deformity   . Foot pain   . Foot pain, left   . Glaucoma   . Hypertension   . Keratoderma   . Macular scar of left eye   . Optic atrophy of left eye   . Senile nuclear sclerosis     Patient Active Problem List   Diagnosis Date Noted  . Hypertensive urgency 12/28/2016    Past Surgical History:  Procedure Laterality Date  . CHOLECYSTECTOMY    . COLONOSCOPY WITH ESOPHAGOGASTRODUODENOSCOPY (EGD)    . FOOT SURGERY    . HERNIA REPAIR         Home Medications    Prior to Admission medications   Medication Sig Start Date End Date Taking? Authorizing Provider  amLODipine (NORVASC) 10 MG tablet Take by mouth. 05/11/18  Yes [provider]  cholecalciferol (VITAMIN D) 1000 units tablet Take by mouth daily.   Yes [provider]  naproxen (NAPROSYN) 500 MG tablet Take 1 tablet (500 mg total) by mouth 2 (two) times daily as needed for moderate pain. 04/09/20  Yes Marthe Dant G, DO  oxyCODONE (ROXICODONE) 15 MG immediate release tablet Take 15 mg by mouth every 4 (four) hours as needed for pain.   Yes [provider]  cetirizine (ZYRTEC) 10 MG tablet Take 10 mg by mouth daily.  03/23/19  [provider]  losartan-hydrochlorothiazide (HYZAAR) 100-25 MG tablet Take 1 tablet by mouth daily.  03/23/19  [provider]    Social History Social History   Tobacco Use  . Smoking status: Former Games developer  . Smokeless tobacco: Former Clinical biochemist  . Vaping Use: Never used  Substance Use Topics  . Alcohol use: Yes  . Drug use: No     Allergies   Patient has no known allergies.   Review of Systems Review of Systems Per HPI  Physical Exam Triage Vital Signs ED Triage Vitals  Enc Vitals Group     BP 04/09/20 1221 (!) 142/99     Pulse Rate 04/09/20 1221 77     Resp 04/09/20 1221 18     Temp 04/09/20 1221 98.2 F (36.8 C)     Temp Source 04/09/20 1221 Oral     SpO2 04/09/20 1221 97 %     Weight 04/09/20 1219 273 lb (123.8 kg)     Height 04/09/20 1219 6' (1.829 m)     Head Circumference --      Peak Flow --      Pain Score 04/09/20 1219 4     Pain Loc --      Pain Edu? --      Excl. in GC? --    Updated Vital Signs BP (!) 142/99 (BP Location: Left Arm)   Pulse 77  Temp 98.2 F (36.8 C) (Oral)   Resp 18   Ht 6' (1.829 m)   Wt 123.8 kg   SpO2 97%   BMI 37.03 kg/m   Visual Acuity Right Eye Distance:   Left Eye Distance:   Bilateral Distance:    Right Eye Near:   Left Eye Near:    Bilateral Near:     Physical Exam Vitals and nursing note reviewed.  Constitutional:      General: He is not in acute distress.    Appearance: He is well-developed. He is not ill-appearing.  HENT:     Head: Normocephalic and atraumatic.     Mouth/Throat:     Pharynx: Oropharynx is clear. No oropharyngeal exudate.  Eyes:     General:        Right eye: No discharge.        Left eye: No discharge.     Conjunctiva/sclera: Conjunctivae normal.  Cardiovascular:     Rate and Rhythm: Normal rate and regular rhythm.     Heart sounds: No murmur heard.   Pulmonary:     Effort: Pulmonary effort is normal.     Breath sounds: Normal breath sounds. No wheezing, rhonchi or rales.  Neurological:     Mental Status: He is alert.  Psychiatric:        Mood and Affect: Mood normal.        Behavior:  Behavior normal.    UC Treatments / Results  Labs (all labs ordered are listed, but only abnormal results are displayed) Labs Reviewed  GROUP A STREP BY PCR  SARS CORONAVIRUS 2 (TAT 6-24 HRS)    EKG   Radiology No results found.  Procedures Procedures (including critical care time)  Medications Ordered in UC Medications - No data to display  Initial Impression / Assessment and Plan / UC Course  I have reviewed the triage vital signs and the nursing notes.  Pertinent labs & imaging results that were available during my care of the patient were reviewed by me and considered in my medical decision making (see chart for details).    60 year old male presents with a viral respiratory infection.  His strep test is negative today.  Concern for COVID-19.  Awaiting test result.  Naproxen as directed.  Supportive care.  Final Clinical Impressions(s) / UC Diagnoses   Final diagnoses:  Viral respiratory infection  Encounter for laboratory testing for COVID-19 virus     Discharge Instructions     Medication as prescribed.  I am concerned that you have COVID-19. Test results should be available in the next 24 hours. Please check your MyChart account.  Stay home.  Take care  Dr. Adriana Simas    ED Prescriptions    Medication Sig Dispense Auth. Provider   naproxen (NAPROSYN) 500 MG tablet Take 1 tablet (500 mg total) by mouth 2 (two) times daily as needed for moderate pain. 30 tablet Tommie Sams, DO     PDMP not reviewed this encounter.   Tommie Sams, [place] 04/09/20 1501

## 2020-04-10 LAB — SARS CORONAVIRUS 2 (TAT 6-24 HRS): SARS Coronavirus 2: POSITIVE — AB

## 2020-04-21 ENCOUNTER — Other Ambulatory Visit: Payer: Self-pay | Admitting: Family Medicine

## 2020-08-14 ENCOUNTER — Ambulatory Visit
Admission: EM | Admit: 2020-08-14 | Discharge: 2020-08-14 | Disposition: A | Discharge status: Home / Self Care | Payer: Medicare HMO | Attending: Physician Assistant | Admitting: Physician Assistant

## 2020-08-14 ENCOUNTER — Other Ambulatory Visit: Payer: Self-pay

## 2020-08-14 ENCOUNTER — Encounter: Payer: Self-pay | Admitting: Emergency Medicine

## 2020-08-14 DIAGNOSIS — R252 Cramp and spasm: Secondary | ICD-10-CM

## 2020-08-14 DIAGNOSIS — M549 Dorsalgia, unspecified: Secondary | ICD-10-CM

## 2020-08-14 MED ORDER — BACLOFEN 10 MG PO TABS: 10.0000 mg | ORAL_TABLET | Freq: Three times a day (TID) | ORAL | 0 refills | Status: AC

## 2020-08-14 MED ORDER — MELOXICAM 15 MG PO TABS: 15.0000 mg | ORAL_TABLET | Freq: Every day | ORAL | 0 refills | Status: AC

## 2020-08-14 NOTE — ED Provider Notes (Signed)
[facility]    CSN: [id number] Arrival date & time: 08/14/20  1122      History   Chief Complaint Chief Complaint  Patient presents with  . Shoulder Pain    Left    HPI Chris Cummings is a 60 y.o. male presenting for pain at the left upper back and posterior scapula x1 week.  He denies any injury.  Patient states that he just woke up and moved in bed and felt a sharp pain.  He says that the pain comes and goes.  No radiation of pain to her from his neck or to his upper extremities.  Has been taking ibuprofen without relief.  Patient also has oxycodone that he takes for chronic pain.  Patient states neither medication of seem to help.  He has no other concerns.  HPI  Past Medical History:  Diagnosis Date  . Back pain   . Callus of foot   . Foot deformity   . Foot pain   . Foot pain, left   . Glaucoma   . Hypertension   . Keratoderma   . Macular scar of left eye   . Optic atrophy of left eye   . Senile nuclear sclerosis     Patient Active Problem List   Diagnosis Date Noted  . Hypertensive urgency 12/28/2016    Past Surgical History:  Procedure Laterality Date  . CHOLECYSTECTOMY    . COLONOSCOPY WITH ESOPHAGOGASTRODUODENOSCOPY (EGD)    . FOOT SURGERY    . HERNIA REPAIR         Home Medications    Prior to Admission medications   Medication Sig Start Date End Date Taking? Authorizing Provider  baclofen (LIORESAL) 10 MG tablet Take 1 tablet (10 mg total) by mouth 3 (three) times daily for 10 days. 08/14/20 08/24/20 Yes Shirlee Latch, PA-C  meloxicam (MOBIC) 15 MG tablet Take 1 tablet (15 mg total) by mouth daily for 15 days. 08/14/20 08/29/20 Yes Eusebio Friendly B, PA-C  amLODipine (NORVASC) 10 MG tablet Take by mouth. 05/11/18   [provider]  cholecalciferol (VITAMIN D) 1000 units tablet Take by mouth daily.    [provider]  oxyCODONE (ROXICODONE) 15 MG immediate release tablet Take 15 mg by mouth every 4 (four) hours as needed for  pain.    [provider]  cetirizine (ZYRTEC) 10 MG tablet Take 10 mg by mouth daily.  03/23/19  [provider]  losartan-hydrochlorothiazide (HYZAAR) 100-25 MG tablet Take 1 tablet by mouth daily.  03/23/19  [provider]    Family History History reviewed. No pertinent family history.  Social History Social History   Tobacco Use  . Smoking status: Former Games developer  . Smokeless tobacco: Former Clinical biochemist  . Vaping Use: Never used  Substance Use Topics  . Alcohol use: Yes  . Drug use: No     Allergies   Patient has no known allergies.   Review of Systems Review of Systems  Constitutional: Negative for fatigue and fever.  Musculoskeletal: Positive for back pain. Negative for arthralgias, joint swelling, neck pain and neck stiffness.  Skin: Negative for rash and wound.  Neurological: Negative for weakness and numbness.     Physical Exam Triage Vital Signs ED Triage Vitals [08/14/20 1152]  Enc Vitals Group     BP (!) 134/97     Pulse Rate 61     Resp 18     Temp 97.9 F (36.6 C)  Temp Source Oral     SpO2 99 %     Weight      Height      Head Circumference      Peak Flow      Pain Score 7     Pain Loc      Pain Edu?      Excl. in GC?    No data found.  Updated Vital Signs BP (!) 134/97 (BP Location: Right Arm)   Pulse 61   Temp 97.9 F (36.6 C) (Oral)   Resp 18   SpO2 99%      Physical Exam Vitals and nursing note reviewed.  Constitutional:      General: He is not in acute distress.    Appearance: Normal appearance. He is well-developed. He is not ill-appearing.  Eyes:     General: No scleral icterus.    Conjunctiva/sclera: Conjunctivae normal.  Cardiovascular:     Rate and Rhythm: Normal rate and regular rhythm.     Heart sounds: Normal heart sounds.  Pulmonary:     Effort: Pulmonary effort is normal. No respiratory distress.     Breath sounds: Normal breath sounds.  Abdominal:     Palpations: Abdomen  is soft.     Tenderness: There is no abdominal tenderness.  Musculoskeletal:     Cervical back: Neck supple.     Comments: Back: There is tenderness to palpation of the left parathoracic muscles.  Full range of motion of back.  5 out of 5 strength bilateral upper extremities.  Skin:    General: Skin is warm and dry.  Neurological:     General: No focal deficit present.     Mental Status: He is alert. Mental status is at baseline.     Motor: No weakness.     Gait: Gait normal.  Psychiatric:        Mood and Affect: Mood normal.        Behavior: Behavior normal.        Thought Content: Thought content normal.      UC Treatments / Results  Labs (all labs ordered are listed, but only abnormal results are displayed) Labs Reviewed - No data to display  EKG   Radiology No results found.  Procedures Procedures (including critical care time)  Medications Ordered in UC Medications - No data to display  Initial Impression / Assessment and Plan / UC Course  I have reviewed the triage vital signs and the nursing notes.  Pertinent labs & imaging results that were available during my care of the patient were reviewed by me and considered in my medical decision making (see chart for details).   60 year old male presenting for left upper back pain.  His clinical presentation is consistent with thoracic strain/sprain and muscle spasms and cramps.  Advised him to continue his at home oxycodone medication as needed for pain relief.  I have also sent meloxicam and advised him not to take any other NSAIDs with this.  Encouraged massage and local heat.  Also sent baclofen.  Advised him to follow-up with PCP or orthopedics if not improving over the next couple weeks.  ED precautions for back pain reviewed.  Final Clinical Impressions(s) / UC Diagnoses   Final diagnoses:  Upper back pain on left side  Muscle cramp     Discharge Instructions     BACK PAIN: Stressed avoiding painful  activities . RICE (REST, ICE, COMPRESSION, ELEVATION) guidelines reviewed. May alternate ice and heat. Consider  use of muscle rubs, Salonpas patches, etc. Use medications as directed including muscle relaxers if prescribed. Take anti-inflammatory medications as prescribed or OTC NSAIDs/Tylenol.  F/u with PCP in 7-10 days for reexamination, and please feel free to call or return to the urgent care at any time for any questions or concerns you may have and we will be happy to help you!   BACK PAIN RED FLAGS: If the back pain acutely worsens or there are any red flag symptoms such as numbness/tingling, leg weakness, saddle anesthesia, or loss of bowel/bladder control, go immediately to the ER. Follow up with [place] as scheduled or sooner if the pain does not begin to resolve or if it worsens before the follow up      ED Prescriptions    Medication Sig Dispense Auth. Provider   baclofen (LIORESAL) 10 MG tablet Take 1 tablet (10 mg total) by mouth 3 (three) times daily for 10 days. 30 each Shirlee Latch, PA-C   meloxicam (MOBIC) 15 MG tablet Take 1 tablet (15 mg total) by mouth daily for 15 days. 15 tablet Shirlee Latch, PA-C     I have reviewed the PDMP during this encounter.   Shirlee Latch, PA-C 08/14/20 1338

## 2020-08-14 NOTE — Discharge Instructions (Signed)

## 2020-08-14 NOTE — ED Triage Notes (Signed)
Pt is present today with left shoulder pain. Pt noticed the discomfort last week. Pt denies and injury

## 2021-02-21 ENCOUNTER — Encounter: Payer: Self-pay | Admitting: Emergency Medicine

## 2021-02-21 ENCOUNTER — Other Ambulatory Visit: Payer: Self-pay

## 2021-02-21 ENCOUNTER — Ambulatory Visit
Admission: EM | Admit: 2021-02-21 | Discharge: 2021-02-21 | Disposition: A | Discharge status: Home / Self Care | Payer: Medicare HMO | Attending: Medical Oncology | Admitting: Medical Oncology

## 2021-02-21 DIAGNOSIS — J029 Acute pharyngitis, unspecified: Secondary | ICD-10-CM | POA: Diagnosis not present

## 2021-02-21 LAB — GROUP A STREP BY PCR: Group A Strep by PCR: NOT DETECTED

## 2021-02-21 LAB — RAPID INFLUENZA A&B ANTIGENS
Influenza A (ARMC): NEGATIVE
Influenza B (ARMC): NEGATIVE

## 2021-02-21 NOTE — ED Triage Notes (Signed)
Pt c/o sore throat. Started last night. Denies other URI symptoms.

## 2021-02-21 NOTE — ED Provider Notes (Signed)
[facility]    CSN: [id number] Arrival date & time: 02/21/21  1739      History   Chief Complaint Chief Complaint  Patient presents with   Sore Throat    HPI Chris Cummings is a 60 y.o. male.   HPI  Sore Throat: Patient reports that since yesterday he has had a sore throat and mild nasal congestion.  Potential cough but he is unsure about this.  No fevers but he did state that he got warm earlier today but he thinks that he just had too many close on.  He denies any shortness of breath, trouble breathing or trouble swallowing.  He has used over-the-counter lozenges to help numb his throat which helps while he is using them but his sore throat continues to come back. No known sick contacts.   Past Medical History:  Diagnosis Date   Back pain    Callus of foot    Foot deformity    Foot pain    Foot pain, left    Glaucoma    Hypertension    Keratoderma    Macular scar of left eye    Optic atrophy of left eye    Senile nuclear sclerosis     Patient Active Problem List   Diagnosis Date Noted   Hypertensive urgency 12/28/2016    Past Surgical History:  Procedure Laterality Date   CHOLECYSTECTOMY     COLONOSCOPY WITH ESOPHAGOGASTRODUODENOSCOPY (EGD)     FOOT SURGERY     HERNIA REPAIR         Home Medications    Prior to Admission medications   Medication Sig Start Date End Date Taking? Authorizing Provider  amLODipine (NORVASC) 10 MG tablet Take by mouth. 05/11/18  Yes [provider]  enalapril (VASOTEC) 10 MG tablet enalapril maleate 10 mg tablet   Yes [provider]  latanoprost (XALATAN) 0.005 % ophthalmic solution Apply to eye. 05/18/18  Yes [provider]  olmesartan-hydrochlorothiazide (BENICAR HCT) 40-25 MG tablet olmesartan 40 mg-hydrochlorothiazide 25 mg tablet   Yes [provider]  oxyCODONE (ROXICODONE) 15 MG immediate release tablet Take 15 mg by mouth every 4 (four) hours as needed for pain.   Yes  [provider]  cholecalciferol (VITAMIN D) 1000 units tablet Take by mouth daily.    [provider]  pantoprazole (PROTONIX) 40 MG tablet pantoprazole 40 mg tablet,delayed release    [provider]  cetirizine (ZYRTEC) 10 MG tablet Take 10 mg by mouth daily.  03/23/19  [provider]  losartan-hydrochlorothiazide (HYZAAR) 100-25 MG tablet Take 1 tablet by mouth daily.  03/23/19  [provider]    Family History History reviewed. No pertinent family history.  Social History Social History   Tobacco Use   Smoking status: Former   Smokeless tobacco: Former  Building services engineer Use: Never used  Substance Use Topics   Alcohol use: Yes   Drug use: No     Allergies   Patient has no known allergies.   Review of Systems Review of Systems  As stated above in HPI Physical Exam Triage Vital Signs ED Triage Vitals  Enc Vitals Group     BP 02/21/21 1832 (!) 146/102     Pulse Rate 02/21/21 1832 79     Resp 02/21/21 1832 18     Temp 02/21/21 1832 98.6 F (37 C)     Temp Source 02/21/21 1832 Oral     SpO2 02/21/21 1832 95 %  Weight 02/21/21 1828 272 lb 14.9 oz (123.8 kg)     Height 02/21/21 1828 6' (1.829 m)     Head Circumference --      Peak Flow --      Pain Score 02/21/21 1828 7     Pain Loc --      Pain Edu? --      Excl. in GC? --    No data found.  Updated Vital Signs BP (!) 146/102 (BP Location: Left Arm) Comment: Pt states he has not taken hi BP medications today.  Pulse 79   Temp 98.6 F (37 C) (Oral)   Resp 18   Ht 6' (1.829 m)   Wt 272 lb 14.9 oz (123.8 kg)   SpO2 95%   BMI 37.02 kg/m   Physical Exam Vitals and nursing note reviewed.  Constitutional:      General: He is not in acute distress.    Appearance: He is well-developed. He is not ill-appearing, toxic-appearing or diaphoretic.  HENT:     Head: Normocephalic and atraumatic.     Right Ear: Tympanic membrane normal. No middle ear effusion.  Tympanic membrane is not erythematous.     Left Ear: Tympanic membrane normal.  No middle ear effusion. Tympanic membrane is not erythematous.     Nose: Congestion and rhinorrhea present.     Comments: Mild post nasal drainage    Mouth/Throat:     Mouth: Mucous membranes are moist. No oral lesions.     Pharynx: Oropharynx is clear. Uvula midline. Posterior oropharyngeal erythema present. No pharyngeal swelling, oropharyngeal exudate or uvula swelling.     Tonsils: No tonsillar exudate or tonsillar abscesses.  Eyes:     Conjunctiva/sclera: Conjunctivae normal.     Pupils: Pupils are equal, round, and reactive to light.  Cardiovascular:     Rate and Rhythm: Normal rate and regular rhythm.     Heart sounds: Normal heart sounds.  Pulmonary:     Effort: Pulmonary effort is normal.     Breath sounds: Normal breath sounds.  Musculoskeletal:     Cervical back: Normal range of motion and neck supple.  Lymphadenopathy:     Cervical: No cervical adenopathy.  Skin:    General: Skin is warm.  Neurological:     Mental Status: He is alert and oriented to person, place, and time.     UC Treatments / Results  Labs (all labs ordered are listed, but only abnormal results are displayed) Labs Reviewed  GROUP A STREP BY PCR    EKG   Radiology No results found.  Procedures Procedures (including critical care time)  Medications Ordered in UC Medications - No data to display  Initial Impression / Assessment and Plan / UC Course  I have reviewed the triage vital signs and the nursing notes.  Pertinent labs & imaging results that were available during my care of the patient were reviewed by me and considered in my medical decision making (see chart for details).     New. Rapid strep and influenza are both negative. I have recommended, warm water saltwater gargles, motrin, sore throat lozenges, flonase.  Final Clinical Impressions(s) / UC Diagnoses   Final diagnoses:  None   Discharge  Instructions   None    ED Prescriptions   None    PDMP not reviewed this encounter.   Rushie Chestnut, [place] 02/21/21 1919

## 2021-12-12 ENCOUNTER — Encounter: Payer: Self-pay | Admitting: Emergency Medicine

## 2021-12-12 ENCOUNTER — Ambulatory Visit
Admission: EM | Admit: 2021-12-12 | Discharge: 2021-12-12 | Disposition: A | Discharge status: Home / Self Care | Payer: Medicare HMO | Attending: Emergency Medicine | Admitting: Emergency Medicine

## 2021-12-12 DIAGNOSIS — M25512 Pain in left shoulder: Secondary | ICD-10-CM

## 2021-12-12 MED ORDER — CYCLOBENZAPRINE HCL 10 MG PO TABS: 10.0000 mg | ORAL_TABLET | Freq: Every day | ORAL | 0 refills | Status: AC

## 2021-12-12 MED ORDER — PREDNISONE 20 MG PO TABS: 40.0000 mg | ORAL_TABLET | Freq: Every day | ORAL | 0 refills | Status: AC
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
## 2021-12-12 NOTE — Discharge Instructions (Signed)
Your pain is most likely caused by irritation to the muscles.  On exam you do have some tenderness over the rotator cuff and if your pain continues to persist after 2 weeks I would recommend evaluation with an orthopedic doctor, information is on the front of your paperwork  Begin prednisone every morning with food for 5 days, this will reduce the inflammatory response that occurs with injury in turn will help with your pain, you may take Tylenol 500 to 1000 mg every 6 hours in addition to this  You may take muscle relaxer at bedtime for additional comfort, please be mindful this medication may make you drowsy  You may use heating pad in 15 minute intervals as needed for additional comfort, within or  you may find comfort in using ice in 10-15 minutes over affected area  Begin stretching affected area daily for 10 minutes as tolerated to further loosen muscles   When lying down place pillow underneath arm and behind shoulder

## 2021-12-26 ENCOUNTER — Ambulatory Visit: Payer: Medicare HMO | Admitting: Physical Therapy
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
